# Patient Record
Sex: Female | Born: 1990
Health system: Southern US, Community
[De-identification: ages and names within clinical notes are randomized; demographics above are authoritative.]

## PROBLEM LIST (undated history)

## (undated) DIAGNOSIS — E282 Polycystic ovarian syndrome: Secondary | ICD-10-CM

## (undated) DIAGNOSIS — E039 Hypothyroidism, unspecified: Secondary | ICD-10-CM

## (undated) HISTORY — DX: Hypothyroidism, unspecified: E03.9

## (undated) HISTORY — DX: Polycystic ovarian syndrome: E28.2

## (undated) HISTORY — PX: WISDOM TOOTH EXTRACTION: SHX21

---

## 2016-02-18 DIAGNOSIS — J309 Allergic rhinitis, unspecified: Secondary | ICD-10-CM | POA: Diagnosis not present

## 2016-02-18 DIAGNOSIS — J019 Acute sinusitis, unspecified: Secondary | ICD-10-CM | POA: Diagnosis not present

## 2016-02-18 DIAGNOSIS — J029 Acute pharyngitis, unspecified: Secondary | ICD-10-CM | POA: Diagnosis not present

## 2016-08-02 DIAGNOSIS — R300 Dysuria: Secondary | ICD-10-CM | POA: Diagnosis not present

## 2016-08-02 DIAGNOSIS — Z6833 Body mass index (BMI) 33.0-33.9, adult: Secondary | ICD-10-CM | POA: Diagnosis not present

## 2016-08-11 DIAGNOSIS — J0101 Acute recurrent maxillary sinusitis: Secondary | ICD-10-CM | POA: Diagnosis not present

## 2016-08-11 DIAGNOSIS — J209 Acute bronchitis, unspecified: Secondary | ICD-10-CM | POA: Diagnosis not present

## 2016-08-11 DIAGNOSIS — Z6833 Body mass index (BMI) 33.0-33.9, adult: Secondary | ICD-10-CM | POA: Diagnosis not present

## 2016-10-03 DIAGNOSIS — J019 Acute sinusitis, unspecified: Secondary | ICD-10-CM | POA: Diagnosis not present

## 2016-10-03 DIAGNOSIS — Z6832 Body mass index (BMI) 32.0-32.9, adult: Secondary | ICD-10-CM | POA: Diagnosis not present

## 2016-10-13 DIAGNOSIS — R05 Cough: Secondary | ICD-10-CM | POA: Diagnosis not present

## 2016-10-13 DIAGNOSIS — Z6833 Body mass index (BMI) 33.0-33.9, adult: Secondary | ICD-10-CM | POA: Diagnosis not present

## 2016-10-13 DIAGNOSIS — J209 Acute bronchitis, unspecified: Secondary | ICD-10-CM | POA: Diagnosis not present

## 2016-10-18 DIAGNOSIS — J209 Acute bronchitis, unspecified: Secondary | ICD-10-CM | POA: Diagnosis not present

## 2016-10-18 DIAGNOSIS — Z6833 Body mass index (BMI) 33.0-33.9, adult: Secondary | ICD-10-CM | POA: Diagnosis not present

## 2016-11-16 DIAGNOSIS — N3001 Acute cystitis with hematuria: Secondary | ICD-10-CM | POA: Diagnosis not present

## 2016-11-16 DIAGNOSIS — Z6832 Body mass index (BMI) 32.0-32.9, adult: Secondary | ICD-10-CM | POA: Diagnosis not present

## 2016-11-16 DIAGNOSIS — R300 Dysuria: Secondary | ICD-10-CM | POA: Diagnosis not present

## 2016-11-28 DIAGNOSIS — Z01419 Encounter for gynecological examination (general) (routine) without abnormal findings: Secondary | ICD-10-CM | POA: Diagnosis not present

## 2016-11-28 DIAGNOSIS — Z6832 Body mass index (BMI) 32.0-32.9, adult: Secondary | ICD-10-CM | POA: Diagnosis not present

## 2017-02-02 DIAGNOSIS — Z6832 Body mass index (BMI) 32.0-32.9, adult: Secondary | ICD-10-CM | POA: Diagnosis not present

## 2017-02-02 DIAGNOSIS — J301 Allergic rhinitis due to pollen: Secondary | ICD-10-CM | POA: Diagnosis not present

## 2017-02-02 DIAGNOSIS — R300 Dysuria: Secondary | ICD-10-CM | POA: Diagnosis not present

## 2017-02-16 DIAGNOSIS — N6320 Unspecified lump in the left breast, unspecified quadrant: Secondary | ICD-10-CM | POA: Diagnosis not present

## 2017-03-16 DIAGNOSIS — R928 Other abnormal and inconclusive findings on diagnostic imaging of breast: Secondary | ICD-10-CM | POA: Diagnosis not present

## 2017-03-16 DIAGNOSIS — N6313 Unspecified lump in the right breast, lower outer quadrant: Secondary | ICD-10-CM | POA: Diagnosis not present

## 2017-04-04 DIAGNOSIS — R197 Diarrhea, unspecified: Secondary | ICD-10-CM | POA: Diagnosis not present

## 2017-04-04 DIAGNOSIS — R1013 Epigastric pain: Secondary | ICD-10-CM | POA: Diagnosis not present

## 2017-04-04 DIAGNOSIS — F1010 Alcohol abuse, uncomplicated: Secondary | ICD-10-CM | POA: Diagnosis not present

## 2017-04-04 DIAGNOSIS — R319 Hematuria, unspecified: Secondary | ICD-10-CM | POA: Diagnosis not present

## 2017-04-07 DIAGNOSIS — R3129 Other microscopic hematuria: Secondary | ICD-10-CM | POA: Diagnosis not present

## 2017-08-28 DIAGNOSIS — A630 Anogenital (venereal) warts: Secondary | ICD-10-CM | POA: Diagnosis not present

## 2017-08-28 DIAGNOSIS — L918 Other hypertrophic disorders of the skin: Secondary | ICD-10-CM | POA: Diagnosis not present

## 2017-09-08 DIAGNOSIS — N9089 Other specified noninflammatory disorders of vulva and perineum: Secondary | ICD-10-CM | POA: Diagnosis not present

## 2017-11-10 ENCOUNTER — Other Ambulatory Visit (HOSPITAL_COMMUNITY)
Admission: RE | Admit: 2017-11-10 | Discharge: 2017-11-10 | Disposition: A | Discharge status: Home / Self Care | Payer: BLUE CROSS/BLUE SHIELD | Attending: Urology | Admitting: Urology

## 2017-11-10 ENCOUNTER — Ambulatory Visit (HOSPITAL_COMMUNITY)
Admission: RE | Admit: 2017-11-10 | Discharge: 2017-11-10 | Disposition: A | Discharge status: Home / Self Care | Payer: BLUE CROSS/BLUE SHIELD | Source: Ambulatory Visit | Attending: Urology | Admitting: Urology

## 2017-11-10 ENCOUNTER — Other Ambulatory Visit: Payer: Self-pay | Admitting: Urology

## 2017-11-10 ENCOUNTER — Ambulatory Visit: Payer: BLUE CROSS/BLUE SHIELD | Admitting: Urology

## 2017-11-10 DIAGNOSIS — R3121 Asymptomatic microscopic hematuria: Secondary | ICD-10-CM

## 2017-11-10 DIAGNOSIS — Z87440 Personal history of urinary (tract) infections: Secondary | ICD-10-CM | POA: Diagnosis not present

## 2017-11-10 DIAGNOSIS — R3129 Other microscopic hematuria: Secondary | ICD-10-CM | POA: Diagnosis not present

## 2017-11-10 LAB — URINALYSIS, COMPLETE (UACMP) WITH MICROSCOPIC
Bacteria, UA: NONE SEEN
Bilirubin Urine: NEGATIVE
GLUCOSE, UA: NEGATIVE mg/dL
Ketones, ur: NEGATIVE mg/dL
LEUKOCYTES UA: NEGATIVE
NITRITE: NEGATIVE
PH: 5 (ref 5.0–8.0)
Protein, ur: NEGATIVE mg/dL
SPECIFIC GRAVITY, URINE: 1.024 (ref 1.005–1.030)

## 2017-11-10 IMAGING — DX DG ABDOMEN 1V
1 series · 1 of 1 positions shown · non-contrast
Comparison: No prior.

CLINICAL DATA: Pain on left.  Macro hematuria.

EXAM:
ABDOMEN - 1 VIEW

[abdomen kub]
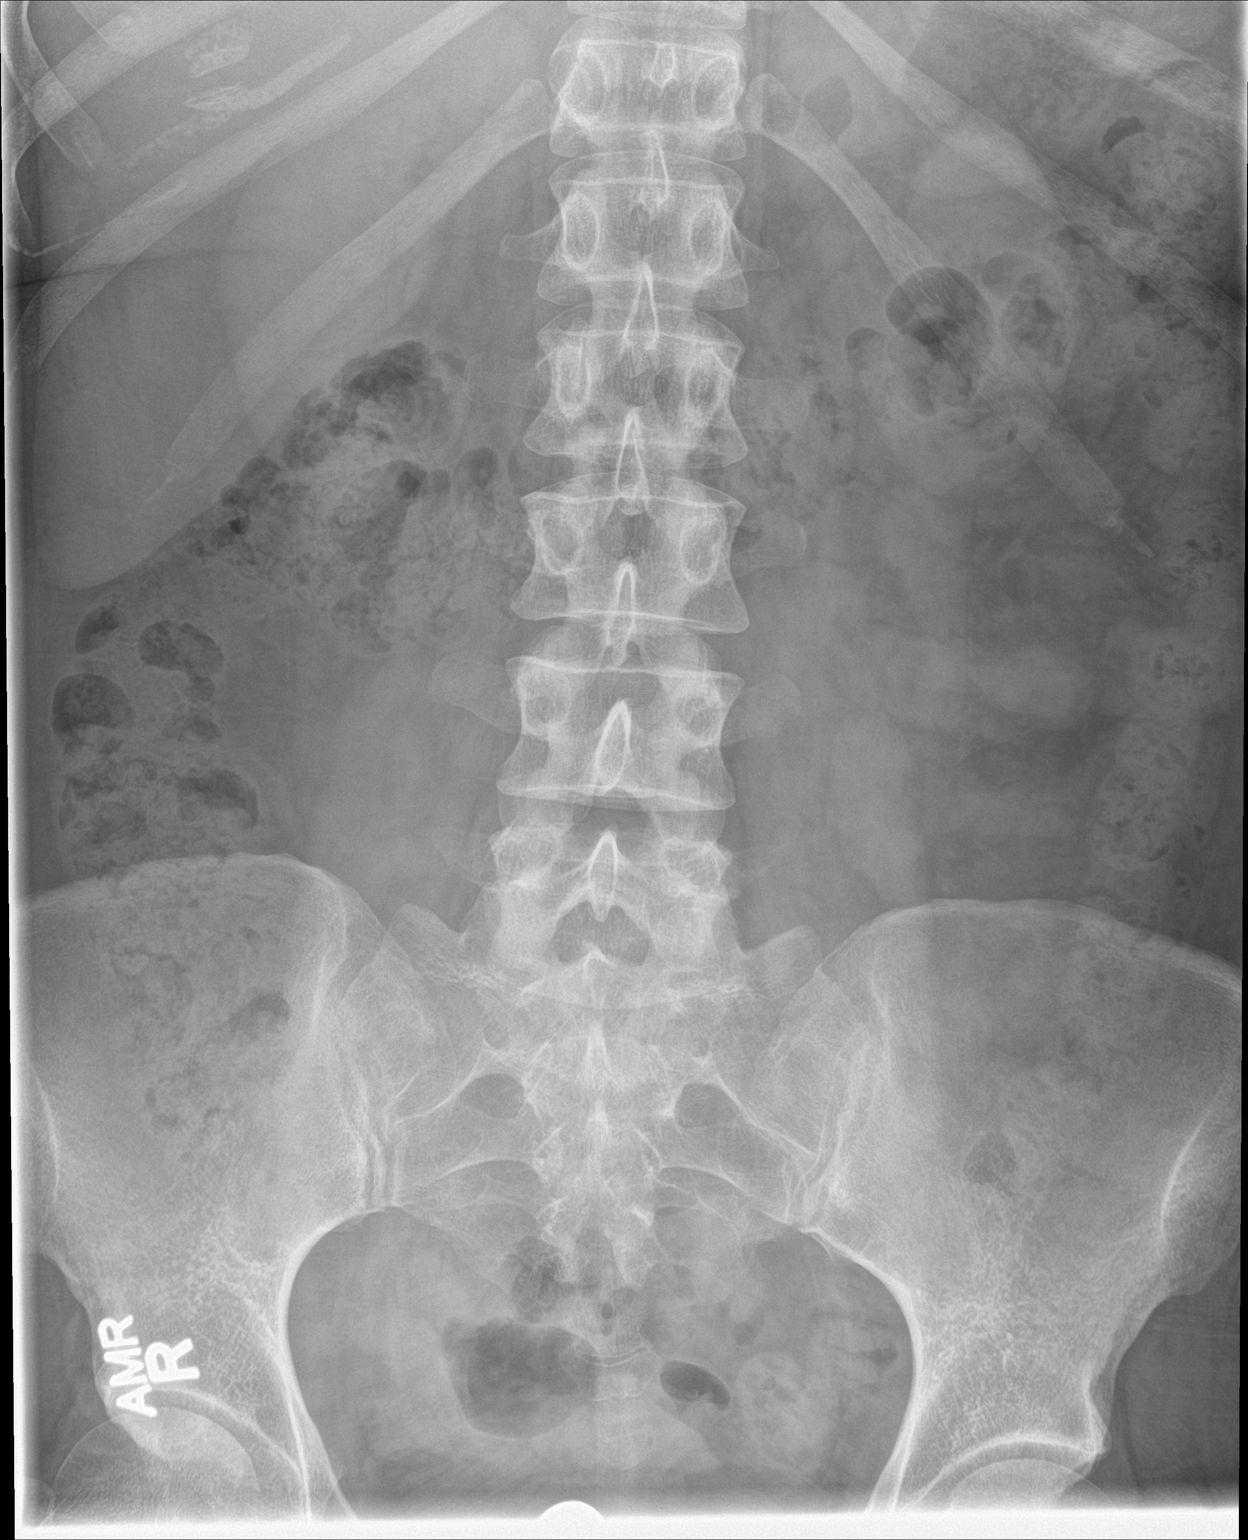

[1 of 1 positions shown; findings below may reference images not displayed]

FINDINGS: Soft tissue structures are unremarkable. No pathologic
intra-abdominal calcifications. No bowel distention. Stool noted
throughout the colon. No free air. No acute bony abnormality.
IMPRESSION: No acute abnormality identified. No evidence of nephrolithiasis or
urolithiasis. Stool noted throughout the colon. Constipation cannot
be excluded.

## 2018-01-30 DIAGNOSIS — Z01419 Encounter for gynecological examination (general) (routine) without abnormal findings: Secondary | ICD-10-CM | POA: Diagnosis not present

## 2018-01-30 DIAGNOSIS — Z6827 Body mass index (BMI) 27.0-27.9, adult: Secondary | ICD-10-CM | POA: Diagnosis not present

## 2018-01-30 DIAGNOSIS — R300 Dysuria: Secondary | ICD-10-CM | POA: Diagnosis not present

## 2018-02-09 ENCOUNTER — Ambulatory Visit: Payer: BLUE CROSS/BLUE SHIELD | Admitting: Urology

## 2018-02-09 DIAGNOSIS — R311 Benign essential microscopic hematuria: Secondary | ICD-10-CM

## 2018-04-26 DIAGNOSIS — E039 Hypothyroidism, unspecified: Secondary | ICD-10-CM | POA: Diagnosis not present

## 2018-04-26 DIAGNOSIS — Z6830 Body mass index (BMI) 30.0-30.9, adult: Secondary | ICD-10-CM | POA: Diagnosis not present

## 2018-05-17 DIAGNOSIS — Z6830 Body mass index (BMI) 30.0-30.9, adult: Secondary | ICD-10-CM | POA: Diagnosis not present

## 2018-05-17 DIAGNOSIS — R300 Dysuria: Secondary | ICD-10-CM | POA: Diagnosis not present

## 2018-05-17 DIAGNOSIS — N3001 Acute cystitis with hematuria: Secondary | ICD-10-CM | POA: Diagnosis not present

## 2018-07-27 DIAGNOSIS — Z6831 Body mass index (BMI) 31.0-31.9, adult: Secondary | ICD-10-CM | POA: Diagnosis not present

## 2018-07-27 DIAGNOSIS — R21 Rash and other nonspecific skin eruption: Secondary | ICD-10-CM | POA: Diagnosis not present

## 2018-08-24 ENCOUNTER — Other Ambulatory Visit (HOSPITAL_COMMUNITY)
Admission: RE | Admit: 2018-08-24 | Discharge: 2018-08-24 | Disposition: A | Discharge status: Home / Self Care | Payer: BLUE CROSS/BLUE SHIELD | Source: Other Acute Inpatient Hospital | Attending: Urology | Admitting: Urology

## 2018-08-24 ENCOUNTER — Ambulatory Visit: Payer: BLUE CROSS/BLUE SHIELD | Admitting: Urology

## 2018-08-24 DIAGNOSIS — Z87440 Personal history of urinary (tract) infections: Secondary | ICD-10-CM

## 2018-08-24 DIAGNOSIS — R3121 Asymptomatic microscopic hematuria: Secondary | ICD-10-CM | POA: Insufficient documentation

## 2018-08-24 LAB — URINALYSIS, COMPLETE (UACMP) WITH MICROSCOPIC
BILIRUBIN URINE: NEGATIVE
Bacteria, UA: NONE SEEN
GLUCOSE, UA: NEGATIVE mg/dL
Ketones, ur: NEGATIVE mg/dL
Leukocytes, UA: NEGATIVE
Nitrite: NEGATIVE
PH: 7 (ref 5.0–8.0)
Protein, ur: NEGATIVE mg/dL
SPECIFIC GRAVITY, URINE: 1.002 — AB (ref 1.005–1.030)

## 2018-11-14 DIAGNOSIS — Z6832 Body mass index (BMI) 32.0-32.9, adult: Secondary | ICD-10-CM | POA: Diagnosis not present

## 2018-11-14 DIAGNOSIS — E039 Hypothyroidism, unspecified: Secondary | ICD-10-CM | POA: Diagnosis not present

## 2018-11-14 DIAGNOSIS — I872 Venous insufficiency (chronic) (peripheral): Secondary | ICD-10-CM | POA: Diagnosis not present

## 2018-12-04 DIAGNOSIS — R05 Cough: Secondary | ICD-10-CM | POA: Diagnosis not present

## 2018-12-04 DIAGNOSIS — Z6832 Body mass index (BMI) 32.0-32.9, adult: Secondary | ICD-10-CM | POA: Diagnosis not present

## 2018-12-04 DIAGNOSIS — J111 Influenza due to unidentified influenza virus with other respiratory manifestations: Secondary | ICD-10-CM | POA: Diagnosis not present

## 2019-01-04 DIAGNOSIS — Z6833 Body mass index (BMI) 33.0-33.9, adult: Secondary | ICD-10-CM | POA: Diagnosis not present

## 2019-01-04 DIAGNOSIS — J309 Allergic rhinitis, unspecified: Secondary | ICD-10-CM | POA: Diagnosis not present

## 2019-04-27 DIAGNOSIS — K219 Gastro-esophageal reflux disease without esophagitis: Secondary | ICD-10-CM | POA: Diagnosis not present

## 2019-04-27 DIAGNOSIS — E039 Hypothyroidism, unspecified: Secondary | ICD-10-CM | POA: Diagnosis not present

## 2019-04-27 DIAGNOSIS — J029 Acute pharyngitis, unspecified: Secondary | ICD-10-CM | POA: Diagnosis not present

## 2019-08-13 DIAGNOSIS — R102 Pelvic and perineal pain: Secondary | ICD-10-CM | POA: Diagnosis not present

## 2019-08-13 DIAGNOSIS — R109 Unspecified abdominal pain: Secondary | ICD-10-CM | POA: Diagnosis not present

## 2019-08-13 DIAGNOSIS — R1031 Right lower quadrant pain: Secondary | ICD-10-CM | POA: Diagnosis not present

## 2019-08-13 DIAGNOSIS — R9389 Abnormal findings on diagnostic imaging of other specified body structures: Secondary | ICD-10-CM | POA: Diagnosis not present

## 2019-08-13 DIAGNOSIS — R110 Nausea: Secondary | ICD-10-CM | POA: Diagnosis not present

## 2019-08-29 DIAGNOSIS — K1120 Sialoadenitis, unspecified: Secondary | ICD-10-CM | POA: Diagnosis not present

## 2019-11-08 DIAGNOSIS — R102 Pelvic and perineal pain: Secondary | ICD-10-CM | POA: Diagnosis not present

## 2019-11-22 DIAGNOSIS — N979 Female infertility, unspecified: Secondary | ICD-10-CM | POA: Diagnosis not present

## 2019-11-22 DIAGNOSIS — Z01419 Encounter for gynecological examination (general) (routine) without abnormal findings: Secondary | ICD-10-CM | POA: Diagnosis not present

## 2019-11-22 DIAGNOSIS — Z1151 Encounter for screening for human papillomavirus (HPV): Secondary | ICD-10-CM | POA: Diagnosis not present

## 2019-12-09 DIAGNOSIS — Z3149 Encounter for other procreative investigation and testing: Secondary | ICD-10-CM | POA: Diagnosis not present

## 2019-12-11 DIAGNOSIS — Z3141 Encounter for fertility testing: Secondary | ICD-10-CM | POA: Diagnosis not present

## 2019-12-26 DIAGNOSIS — Z319 Encounter for procreative management, unspecified: Secondary | ICD-10-CM | POA: Diagnosis not present

## 2020-01-21 DIAGNOSIS — Z319 Encounter for procreative management, unspecified: Secondary | ICD-10-CM | POA: Diagnosis not present

## 2020-02-03 DIAGNOSIS — E039 Hypothyroidism, unspecified: Secondary | ICD-10-CM | POA: Diagnosis not present

## 2020-02-03 DIAGNOSIS — R5381 Other malaise: Secondary | ICD-10-CM | POA: Diagnosis not present

## 2020-02-03 DIAGNOSIS — F17210 Nicotine dependence, cigarettes, uncomplicated: Secondary | ICD-10-CM | POA: Diagnosis not present

## 2020-02-03 DIAGNOSIS — Z13220 Encounter for screening for lipoid disorders: Secondary | ICD-10-CM | POA: Diagnosis not present

## 2020-02-03 DIAGNOSIS — K219 Gastro-esophageal reflux disease without esophagitis: Secondary | ICD-10-CM | POA: Diagnosis not present

## 2020-02-05 DIAGNOSIS — R7989 Other specified abnormal findings of blood chemistry: Secondary | ICD-10-CM | POA: Diagnosis not present

## 2020-02-05 DIAGNOSIS — Z720 Tobacco use: Secondary | ICD-10-CM | POA: Diagnosis not present

## 2020-02-05 DIAGNOSIS — Z6833 Body mass index (BMI) 33.0-33.9, adult: Secondary | ICD-10-CM | POA: Diagnosis not present

## 2020-02-05 DIAGNOSIS — E782 Mixed hyperlipidemia: Secondary | ICD-10-CM | POA: Diagnosis not present

## 2020-05-13 DIAGNOSIS — Z3161 Procreative counseling and advice using natural family planning: Secondary | ICD-10-CM | POA: Diagnosis not present

## 2020-05-13 DIAGNOSIS — E669 Obesity, unspecified: Secondary | ICD-10-CM | POA: Diagnosis not present

## 2020-05-13 DIAGNOSIS — E282 Polycystic ovarian syndrome: Secondary | ICD-10-CM | POA: Diagnosis not present

## 2020-06-10 DIAGNOSIS — E782 Mixed hyperlipidemia: Secondary | ICD-10-CM | POA: Diagnosis not present

## 2020-06-10 DIAGNOSIS — Z720 Tobacco use: Secondary | ICD-10-CM | POA: Diagnosis not present

## 2020-06-10 DIAGNOSIS — K219 Gastro-esophageal reflux disease without esophagitis: Secondary | ICD-10-CM | POA: Diagnosis not present

## 2020-06-10 DIAGNOSIS — R7989 Other specified abnormal findings of blood chemistry: Secondary | ICD-10-CM | POA: Diagnosis not present

## 2020-06-23 DIAGNOSIS — Z3161 Procreative counseling and advice using natural family planning: Secondary | ICD-10-CM | POA: Diagnosis not present

## 2020-06-23 DIAGNOSIS — Z1389 Encounter for screening for other disorder: Secondary | ICD-10-CM | POA: Diagnosis not present

## 2020-06-23 DIAGNOSIS — E282 Polycystic ovarian syndrome: Secondary | ICD-10-CM | POA: Diagnosis not present

## 2020-06-23 DIAGNOSIS — Z6832 Body mass index (BMI) 32.0-32.9, adult: Secondary | ICD-10-CM | POA: Diagnosis not present

## 2020-06-23 DIAGNOSIS — E669 Obesity, unspecified: Secondary | ICD-10-CM | POA: Diagnosis not present

## 2020-06-23 DIAGNOSIS — Z319 Encounter for procreative management, unspecified: Secondary | ICD-10-CM | POA: Diagnosis not present

## 2020-06-23 DIAGNOSIS — E039 Hypothyroidism, unspecified: Secondary | ICD-10-CM | POA: Diagnosis not present

## 2020-06-23 DIAGNOSIS — Z1331 Encounter for screening for depression: Secondary | ICD-10-CM | POA: Diagnosis not present

## 2020-06-23 DIAGNOSIS — R300 Dysuria: Secondary | ICD-10-CM | POA: Diagnosis not present

## 2020-07-20 DIAGNOSIS — Z319 Encounter for procreative management, unspecified: Secondary | ICD-10-CM | POA: Diagnosis not present

## 2020-07-20 DIAGNOSIS — Z3161 Procreative counseling and advice using natural family planning: Secondary | ICD-10-CM | POA: Diagnosis not present

## 2020-07-20 DIAGNOSIS — E669 Obesity, unspecified: Secondary | ICD-10-CM | POA: Diagnosis not present

## 2020-07-20 DIAGNOSIS — E282 Polycystic ovarian syndrome: Secondary | ICD-10-CM | POA: Diagnosis not present

## 2020-08-19 DIAGNOSIS — Z3161 Procreative counseling and advice using natural family planning: Secondary | ICD-10-CM | POA: Diagnosis not present

## 2020-08-19 DIAGNOSIS — E669 Obesity, unspecified: Secondary | ICD-10-CM | POA: Diagnosis not present

## 2020-08-19 DIAGNOSIS — E282 Polycystic ovarian syndrome: Secondary | ICD-10-CM | POA: Diagnosis not present

## 2020-08-19 DIAGNOSIS — Z319 Encounter for procreative management, unspecified: Secondary | ICD-10-CM | POA: Diagnosis not present

## 2020-09-16 DIAGNOSIS — E282 Polycystic ovarian syndrome: Secondary | ICD-10-CM | POA: Diagnosis not present

## 2020-09-16 DIAGNOSIS — E669 Obesity, unspecified: Secondary | ICD-10-CM | POA: Diagnosis not present

## 2020-09-16 DIAGNOSIS — Z3161 Procreative counseling and advice using natural family planning: Secondary | ICD-10-CM | POA: Diagnosis not present

## 2020-09-16 DIAGNOSIS — Z319 Encounter for procreative management, unspecified: Secondary | ICD-10-CM | POA: Diagnosis not present

## 2020-09-21 DIAGNOSIS — E282 Polycystic ovarian syndrome: Secondary | ICD-10-CM | POA: Diagnosis not present

## 2020-09-21 DIAGNOSIS — Z3161 Procreative counseling and advice using natural family planning: Secondary | ICD-10-CM | POA: Diagnosis not present

## 2020-09-21 DIAGNOSIS — N970 Female infertility associated with anovulation: Secondary | ICD-10-CM | POA: Diagnosis not present

## 2021-08-22 ENCOUNTER — Other Ambulatory Visit: Payer: Self-pay

## 2021-08-28 ENCOUNTER — Other Ambulatory Visit: Payer: Self-pay

## 2021-10-06 ENCOUNTER — Other Ambulatory Visit: Payer: Self-pay

## 2021-10-20 ENCOUNTER — Other Ambulatory Visit: Payer: Self-pay | Admitting: Obstetrics and Gynecology

## 2021-10-20 DIAGNOSIS — Z3A19 19 weeks gestation of pregnancy: Secondary | ICD-10-CM

## 2021-10-20 DIAGNOSIS — Z363 Encounter for antenatal screening for malformations: Secondary | ICD-10-CM

## 2021-10-20 DIAGNOSIS — O283 Abnormal ultrasonic finding on antenatal screening of mother: Secondary | ICD-10-CM

## 2021-10-26 ENCOUNTER — Other Ambulatory Visit: Payer: Self-pay

## 2021-10-26 ENCOUNTER — Ambulatory Visit: Payer: BC Managed Care – PPO

## 2021-10-26 ENCOUNTER — Ambulatory Visit: Payer: BC Managed Care – PPO | Attending: Obstetrics and Gynecology

## 2021-10-26 ENCOUNTER — Ambulatory Visit: Payer: BC Managed Care – PPO | Admitting: *Deleted

## 2021-10-26 ENCOUNTER — Ambulatory Visit (HOSPITAL_BASED_OUTPATIENT_CLINIC_OR_DEPARTMENT_OTHER): Payer: BC Managed Care – PPO | Admitting: Maternal & Fetal Medicine

## 2021-10-26 VITALS — BP 127/69 | HR 104 | Ht 68.0 in

## 2021-10-26 DIAGNOSIS — Z3A19 19 weeks gestation of pregnancy: Secondary | ICD-10-CM

## 2021-10-26 DIAGNOSIS — O3510X0 Maternal care for (suspected) chromosomal abnormality in fetus, unspecified, not applicable or unspecified: Secondary | ICD-10-CM

## 2021-10-26 DIAGNOSIS — Q053 Sacral spina bifida with hydrocephalus: Secondary | ICD-10-CM | POA: Insufficient documentation

## 2021-10-26 DIAGNOSIS — Z363 Encounter for antenatal screening for malformations: Secondary | ICD-10-CM | POA: Diagnosis not present

## 2021-10-26 DIAGNOSIS — O283 Abnormal ultrasonic finding on antenatal screening of mother: Secondary | ICD-10-CM

## 2021-10-26 NOTE — Progress Notes (Addendum)
Name: SARELY STRACENER Indication: Pregnancy suspected to have closed spina bifida  DOB: 1991/01/16 Age: 31 y.o.   EDC: 03/18/2022 FET: 06/30/2021 Referring Provider:  Selinda Flavin, MD  EGA: [redacted]w[redacted]d Genetic Counselor: Teena Dunk, MS, CGC  OB Hx: G1P0 Date of Appointment: 10/26/2021  Accompanied by: Her reproductive partner Face to Face Time: 60 Minutes   Previous Testing Completed: CBC from 08/13/2019 reviewed. MCV within normal limits. It is unlikely that [place] is a beta thalassemia carrier or an alpha thalassemia carrier of the double-gene deletion. Individuals with a normal MCV may be single-gene deletion carriers, but it is unlikely that the current pregnancy would be affected with alpha or beta thalassemia major. [place] previously completed Non-Invasive Prenatal Screening (NIPS) in this pregnancy (scanned into Epic under the Media tab). The result is low risk, consistent with a female fetus. This screening significantly reduces the risk that the current pregnancy has Down syndrome, Trisomy 63, Trisomy 52, and common sex chromosome conditions, however, the risk is not zero given the limitations of NIPS. Additionally, there are many genetic conditions that cannot be detected by NIPS.  [place] reports she and her reproductive partner completed carrier screening before they completed IVF. [place] reports they were not carriers for the same recessive genetic conditions. These results are not available in Shakiah's chart at the time of her genetic counseling appointment for genetic counseling to review.  [place] previously completed a maternal serum AFP screen in this pregnancy. The result is screen negative. Closed neural tube defects not be detected by this screen.     Genetic Counseling:   Pregnancy suspected to have closed spina bifida. Spina bifida is a birth defect in which an area of the spinal column does not form properly, leaving a section of the spinal cord and spinal  nerves exposed through an opening in the back. Spina bifida occurs in approximately 1 out of every 2,000 births in the [place]. The location of the spina bifida is most commonly lumbar (75%), followed by sacral (15%), thoracic (9%), and cervical (1%). Common associated anomalies include cardiac defects, renal anomalies, gastrointestinal tract anomalies, club feet, kyphosis, and congenital hip dysplasia. Isolated spina bifida is thought to be due to multifactorial inheritance, with a combination of genetic and environmental influences. The risk for recurrence of apparently isolated spina bifida is about 2% to 3%. If the spina bifida is part of a genetic syndrome, the recurrence risk is that of the underlying syndrome. Folic acid supplementation reduces the risk of isolated spina bifida by 60%-70% but has no effect on cases that are part of a genetic syndrome. Prognosis depends on the size and location of the defect and the presence of associated abnormalities. Genetic counseling reviewed with the couple that a fetal karyotype and fetal microarray could be completed on amniotic fluid to look for a genetic etiology. Amniotic fluid AFP studies with a reflex to acetylcholinesterase testing could be ordered, however, given that Samatha's maternal serum AFP was negative and we are suspecting closed spina bifida, amniotic fluid AFP studies with reflex to acetylcholinesterase testing may not be helpful to further confirm the diagnosis.    Testing/Screening Options:   Amniocentesis. This procedure is available for prenatal diagnosis. Possible procedural difficulties and complications that can arise include maternal infection, cramping, bleeding, fluid leakage, and/or pregnancy loss. The risk for pregnancy loss with an amniocentesis is 1/500. Per the [employer]necologists (ACOG) Practice Bulletin 162, all pregnant women should be offered prenatal assessment for aneuploidy by  diagnostic  testing regardless of maternal age or other risk factors. If indicated, genetic testing that could be ordered on an amniocentesis sample includes a fetal karyotype, fetal microarray, and testing for specific syndromes.     Patient Plan:  Proceed with: Patient opted to pursue amniocentesis for prenatal diagnosis. We will refer [place] to [facility] for this procedure and a consultation with their Maternal Fetal Medicine team.   All questions were answered.    Thank you for sharing in the care of [place] with [place].  Please do not hesitate to contact us if you have any questions.  Teena Dunk, MS, [facility]

## 2021-10-26 NOTE — Progress Notes (Signed)
MFM Consultation  Alyssa Peters is a 31 yo G1P0 who is here at 19w 4 d at the request of Dr. Frances Nickels.  She is seen today due to abnormal intracranial findings on her providers ultrasound.  She is dated by a consistent with LMP and 6 week ultrasound.  Alyssa Peters notes that her prenatal care has been uncomplicated. She has a low risk NIPS and AFP.  Vitals with BMI 10/26/2021 10/20/2021  Height - 5\' 8"   Systolic 127 -  Diastolic 69 -  Pulse 104 -   OB History  Gravida Para Term Preterm AB Living  1 0 0 0 0 0  SAB IAB Ectopic Multiple Live Births  0 0 0 0 0    # Outcome Date GA Lbr Len/2nd Weight Sex Delivery Anes PTL Lv  1 Current            Past Medical History:  Diagnosis Date   Hypothyroidism    PCOS (polycystic ovarian syndrome)    Past Surgical History:  Procedure Laterality Date   WISDOM TOOTH EXTRACTION     Family History  Problem Relation Age of Onset   Hyperlipidemia Mother    Diabetes Mother    Hypertension Mother    Asthma Mother    Kidney disease Father    Hypertension Father    Diabetes Paternal Uncle    Hypertension Paternal Grandmother    Cancer Paternal Grandmother    Kidney disease Paternal Grandfather     Current Outpatient Medications (Endocrine & Metabolic):    levothyroxine (SYNTHROID) 100 MCG tablet, Take 100 mcg by mouth daily before breakfast. One tab mon -fri, two tabs sat and sun    Current Outpatient Medications (Analgesics):    aspirin 81 MG chewable tablet, Chew by mouth daily.   Current Outpatient Medications (Other):    B Complex-C (B-COMPLEX WITH VITAMIN C) tablet, Take 1 tablet by mouth daily.   Omega-3 Fatty Acids (FISH OIL) 1000 MG CAPS, Take 2,000 mg by mouth.   prenatal vitamin w/FE, FA (PRENATAL 1 + 1) 27-1 MG TABS tablet, Take 1 tablet by mouth daily at 12 noon. Allergies  Allergen Reactions   Ceclor [Cefaclor] Rash   Sulfa Antibiotics Rash   Imaging: Today we observed a single intrauterine pregnancy with  measurements consistent with dates.  The estimated fetal weight was 318 g at the 63%.  Suboptimal views of the fetal anatomy was observed secondary to fetal position and maternal habitus.  We observed a lemon and banana signs suggestive, with mild ventriculomegaly of 1.1-1.2 cm today. In addition the cavum septum pellucidum was not observed. Suggestive of chiari 2 malformation.  The fetal heart appeared normal however, there were a few septal views suggestive of a possible small ventricular septal defect. This was not seen in all views. The subcostal views of not well visualized.  In the sagittal spine view we observed a abnormal curvature around S1-L5 area. The area appears to be covered with skin. The area measures < 10 x 5 mm. The spine was adjacent the anterior abdominal wall.   Impression/Counseling:  I discussed with Alyssa Peters and her husband today's findings most consistent with likely spina bifida occulta. I explained that the intracranial findings are highly suggestive of a open neural tube defect and with an normal AFP I explained that this is likely due to the skin covering the area visualized.  I also reviewed that additional imaging with a change in fetal position may allow to further characterize the  location and size, nevertheless we will recommend a fetal MRI.  Fetal neural tube defect has a significant association with other anomalies, in particular the VACTERL (vertebral anal cardiac tracheoesophogeal renal and limb) constellation.   Todays exam reveals no evidence of other anomalies. Neural tube defects are frequently associated with an increased diameter of the lateral ventricles, which when severe is associated with a worse neurologic prognosis. The prognosis regarding motor dysfunction is related to the spinal level of the lesion. Thoracic lesions are associated with paraplegia, while lesions below L4 are typically associated with normal motor function.   A  definitive assessment of ultimate neurologic function is not possible in utero. There is a significant (10%) risk of aneuploidy with neural tube defect, and karyotype analysis is recommended either antenatally or at delivery. We also recommend a fetal echocardiogram given today's findings.  We discussed the various options for shared decision making regarding today's diagnosis. Alyssa Peters and her husband confirmed that they would not terminate based on today's findings but would like to rule out a genetic cause via diagnostic amniocentesis. Risk and benefits of this test were reviewed including 1:500 to 1:1000 chance of perinatal loss due infection, bleeding, and membrane rupture.  In addition, we discussed placing a referral to [facility] for further evaluation at their fetal care center and to review the option of entering a research study and to evaluate their candidacy for fetal surgery with a diagnostic amniocentesis. After review of this option and meeting with our genetic counselor she agreed to this plan.   She was scheduled to see Dr. Barbarann Ehlers on Thursday.   We will see Alyssa Peters in 3-4 weeks for repeat growth and co-manage with [facility]. We will await final decisions and further planning.  All questions answered I spent 60 minutes with > 50% in face to face consultation.   [name] [employer]

## 2021-11-21 ENCOUNTER — Ambulatory Visit (HOSPITAL_COMMUNITY): Payer: BC Managed Care – PPO

## 2021-11-21 ENCOUNTER — Other Ambulatory Visit: Payer: Self-pay

## 2021-11-21 ENCOUNTER — Inpatient Hospital Stay (HOSPITAL_COMMUNITY)
Admission: AD | Admit: 2021-11-21 | Discharge: 2021-11-21 | Disposition: A | Discharge status: Home / Self Care | Payer: BC Managed Care – PPO | Attending: Obstetrics and Gynecology | Admitting: Obstetrics and Gynecology

## 2021-11-21 DIAGNOSIS — Z3402 Encounter for supervision of normal first pregnancy, second trimester: Secondary | ICD-10-CM | POA: Insufficient documentation

## 2021-11-21 DIAGNOSIS — Z3A23 23 weeks gestation of pregnancy: Secondary | ICD-10-CM | POA: Diagnosis not present

## 2021-11-21 DIAGNOSIS — O3508X0 Maternal care for (suspected) central nervous system malformation or damage in fetus, spina bifida, not applicable or unspecified: Secondary | ICD-10-CM | POA: Insufficient documentation

## 2021-11-21 MED ORDER — BETAMETHASONE SOD PHOS & ACET 6 (3-3) MG/ML IJ SUSP
12.0000 mg | Freq: Once | INTRAMUSCULAR | Status: AC
  Administered 2021-11-21: 12 mg via INTRAMUSCULAR
  Filled 2021-11-21: qty 5

## 2021-11-21 NOTE — MAU Note (Signed)
Alyssa Peters is a 31 y.o. at [redacted]w[redacted]d here in MAU reporting: here for 1st BMZ. Is going to have fetal surgery on Tuesday and will get second dose tomorrow at [facility]. No pain, bleeding, or LOF. +FM  Pain score: 0/10  Vitals:   11/21/21 0854  BP: 119/71  Pulse: (!) 102  Resp: 16  Temp: 98.5 F (36.9 C)  SpO2: 99%     FHT:156  Lab orders placed from triage: none

## 2021-11-21 NOTE — MAU Provider Note (Signed)
None    S Ms. RITAANN LEPPO is a 31 y.o. G1P0 at [redacted]w[redacted]d who presents to MAU today for BMZ x1. Patient is scheduled for fetal surgery at [facility] on Tuesday. She will be admitted to [facility] tomorrow morning. She denies contractions, vaginal bleeding, or leaking fluid. Endorses active fetal movement. She has no concerns/questions at this time.   O BP 119/71 (BP Location: Right Arm)    Pulse (!) 102    Temp 98.5 F (36.9 C) (Oral)    Resp 16    LMP 06/11/2021    SpO2 99%  Physical Exam Vitals and nursing note reviewed.  Eyes:     Extraocular Movements: Extraocular movements intact.     Pupils: Pupils are equal, round, and reactive to light.  Cardiovascular:     Rate and Rhythm: Normal rate.  Pulmonary:     Effort: Pulmonary effort is normal.  Abdominal:     Comments: Gravid   Musculoskeletal:        General: Normal range of motion.     Cervical back: Normal range of motion.  Skin:    General: Skin is warm and dry.  Neurological:     General: No focal deficit present.     Mental Status: She is alert and oriented to person, place, and time.  Psychiatric:        Mood and Affect: Mood normal.        Behavior: Behavior normal.        Thought Content: Thought content normal.        Judgment: Judgment normal.   FHR: 156 bpm via doppler  A Medical screening exam complete Betamethasone Injection  P Discharge from MAU in stable condition Warning signs for worsening condition that would warrant emergency follow-up discussed Patient may return to MAU as needed  Keep appointment at [facility] for fetal surgery tomorrow 2/6    Renee Harder, CNM 11/21/2021 9:07 AM

## 2023-05-12 ENCOUNTER — Telehealth: Payer: Self-pay

## 2023-05-31 ENCOUNTER — Ambulatory Visit: Payer: BC Managed Care – PPO | Attending: Obstetrics and Gynecology | Admitting: Obstetrics and Gynecology

## 2023-05-31 ENCOUNTER — Other Ambulatory Visit: Payer: Self-pay

## 2023-05-31 DIAGNOSIS — Z8279 Family history of other congenital malformations, deformations and chromosomal abnormalities: Secondary | ICD-10-CM

## 2023-06-07 ENCOUNTER — Ambulatory Visit: Payer: Self-pay | Admitting: Obstetrics and Gynecology

## 2023-06-07 NOTE — Progress Notes (Cosign Needed Addendum)
Virtual Visit via Video Note  I connected with Alyssa Peters on 06/07/23 at  3:00 PM EDT by a video enabled telemedicine application and verified that I am speaking with the correct person using two identifiers.  Location: Patient: home Provider: [facility]    Referring physician: Dr. Maxie Better Length of Consultation: 40 minutes  Ms. Lasser  was referred to [facility] at [facility] for preconception genetic counseling due to a previous child with an open neural tube defect.  The patient was present on this virtual visit with her partner, Alyssa Peters.  Family history and pregnancy history: We obtained a detailed family history and pregnancy history.  Alyssa and Alyssa Peters and one daughter, Alyssa Peters, who is 65 months old.  They are not currently pregnant, but are consider pregnancy in the future.  [name] was found to have spina bifida on prenatal ultrasound.  The couple was seen initially at [facility] and then transferred to [facility] for management of the pregnancy due to the diagnosis.  In utero repair of the neural tube defect was attempted but was not completed due to complications.  [name] therefore had repair after birth and is doing well.  She has no other medical or developmental concerns that are unrelated to spina bifida.  Their pregnancy with [name] was conceived through IVF and they reported that carrier screening for recessive genetic conditions are negative prior to that pregnancy. We do not have documentation of those results, but are happy to review them if desired.  The family history is remarkable for several relatives with hypertension, heart disease and COPD in adulthood.  We reviewed that these conditions are likely to have both inherited and lifestyle causes.  The remainder of the family history was reported to be unremarkable for birth defects, intellectual delays, recurrent pregnancy loss or known chromosome abnormalities.   Open Neural Tube  Defects: Spina bifida, or ONTDs, are among the most common birth differences, occurring in every 1 in 1000 to 2000 births. Spina bifida is a birth defect in which an area of the spinal column does not form properly, leaving a section of the spinal cord and spinal nerves exposed through an opening in the neural tube. The location of the spina bifida is most commonly lumbar (75%), followed by sacral (15%), thoracic (9%), and cervical (1%). Some cases of neural tube defects are more severe and involve the brain and skull while other cases may be closed defects with few or no clinical effects. It most often occurs as an isolated condition, but can be present along with other birth defects including cardiac defects, renal anomalies, gastrointestinal tract anomalies, club feet, kyphosis, and congenital hip dysplasia. The precise cause of spina bifida is often unknown. Approximately 2-15% of cases of spina bifida are due to a genetic cause. When there is a genetic syndrome as the cause, there are often other birth defects or developmental delays.  If an ONTD is present as an isolated condition, it is thought to be due to multifactorial inheritance, or a combination of both environmental and genetic factors. Certain environmental factors are known to increase the chances of an ONTD, including maternal diabetes, maternal use of medications such as anticonvulsants, maternal obesity, maternal fevers or exposure to high temperatures, and low levels of folate/vitamin B12. However, these environmental factors alone are not thought to be sufficient to cause spina bifida. Most babies with spina bifida are born to women with no risk factors. For a couple who has had  one child with an isolated open neural tube defect, the estimated chance of recurrence for future pregnancies is ~2-5%. If the spina bifida is part of a genetic syndrome, the recurrence risk is that of the underlying syndrome.   It is recommended that women who have  had a child with spina bifida should take 4000 mcg of folic acid rather than the standard 400 mcg of folic acid for one to three months before pregnancy. Data suggests that folic acid supplementation prior to conception reduces the risk of isolated spina bifida by 60%-70% but has no effect on cases that are part of a genetic syndrome.   Diagnosis of open neural tube defects are most often made during pregnancy based upon ultrasound findings.  First trimester ultrasound at 11-14 weeks can often detect abnormalities of the skull/brain such as anencephaly.  In the second trimester, babies with spina bifida may show ventriculomegaly, hydrocephalus, lemon sign (abnormal shape of the cranium with narrowing of frontal bones), banana sign (abnormally shaped cerebellum), Chiari malformation, scoliosis, and clubfoot.  Maternal serum AFP testing can also be used to evaluate the chance for an ONTD in the second trimester.  Diagnostic testing through amniocentesis is available to detect >98% of ONTDs through a measurement of the AFP and acetylcholinesterase in amniotic fluid.  Plan of Care: Alyssa and Alyssa Peters were encouraged to reach out early in any future pregnancy to allow for planning of screening and testing for neural tube defects at the appropriate gestational age.  Ms. Lutman was encouraged to call with questions or concerns.  We can be contacted at [phone].  Cherly Anderson, MS, CGC  I provided 40 minutes of non-face-to-face time during this encounter.  Katrina Stack
